# Patient Record
Sex: Female | Born: 2000
Health system: Southern US, Community
[De-identification: ages and names within clinical notes are randomized; demographics above are authoritative.]

---
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
## 2021-02-08 ENCOUNTER — Other Ambulatory Visit (HOSPITAL_COMMUNITY): Payer: Self-pay | Admitting: Family Medicine

## 2022-03-16 DIAGNOSIS — Z6827 Body mass index (BMI) 27.0-27.9, adult: Secondary | ICD-10-CM | POA: Diagnosis not present

## 2022-03-16 DIAGNOSIS — J039 Acute tonsillitis, unspecified: Secondary | ICD-10-CM | POA: Diagnosis not present

## 2022-03-16 DIAGNOSIS — I889 Nonspecific lymphadenitis, unspecified: Secondary | ICD-10-CM | POA: Diagnosis not present

## 2022-03-16 DIAGNOSIS — R509 Fever, unspecified: Secondary | ICD-10-CM | POA: Diagnosis not present

## 2022-03-16 DIAGNOSIS — J029 Acute pharyngitis, unspecified: Secondary | ICD-10-CM | POA: Diagnosis not present

## 2022-03-16 DIAGNOSIS — R000 Tachycardia, unspecified: Secondary | ICD-10-CM | POA: Diagnosis not present

## 2022-03-16 DIAGNOSIS — G43009 Migraine without aura, not intractable, without status migrainosus: Secondary | ICD-10-CM | POA: Diagnosis not present

## 2022-12-01 DIAGNOSIS — F419 Anxiety disorder, unspecified: Secondary | ICD-10-CM | POA: Diagnosis not present

## 2022-12-15 DIAGNOSIS — F419 Anxiety disorder, unspecified: Secondary | ICD-10-CM | POA: Diagnosis not present

## 2022-12-21 DIAGNOSIS — F419 Anxiety disorder, unspecified: Secondary | ICD-10-CM | POA: Diagnosis not present

## 2023-01-04 DIAGNOSIS — F419 Anxiety disorder, unspecified: Secondary | ICD-10-CM | POA: Diagnosis not present

## 2023-01-18 DIAGNOSIS — F419 Anxiety disorder, unspecified: Secondary | ICD-10-CM | POA: Diagnosis not present

## 2023-02-01 DIAGNOSIS — F419 Anxiety disorder, unspecified: Secondary | ICD-10-CM | POA: Diagnosis not present

## 2023-02-15 DIAGNOSIS — F419 Anxiety disorder, unspecified: Secondary | ICD-10-CM | POA: Diagnosis not present

## 2023-03-01 DIAGNOSIS — F419 Anxiety disorder, unspecified: Secondary | ICD-10-CM | POA: Diagnosis not present

## 2023-03-15 DIAGNOSIS — F419 Anxiety disorder, unspecified: Secondary | ICD-10-CM | POA: Diagnosis not present

## 2023-05-25 DIAGNOSIS — R55 Syncope and collapse: Secondary | ICD-10-CM | POA: Diagnosis not present

## 2023-05-28 DIAGNOSIS — F419 Anxiety disorder, unspecified: Secondary | ICD-10-CM | POA: Diagnosis not present
